# Patient Record
Sex: Female | Born: 1995 | Race: White | Hispanic: No | Marital: Single | Smoking: Never smoker
Health system: Southern US, Community
[De-identification: ages and names within clinical notes are randomized; demographics above are authoritative.]

## PROBLEM LIST (undated history)

## (undated) DIAGNOSIS — J302 Other seasonal allergic rhinitis: Secondary | ICD-10-CM

## (undated) DIAGNOSIS — F988 Other specified behavioral and emotional disorders with onset usually occurring in childhood and adolescence: Secondary | ICD-10-CM

---
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
## 2017-09-30 ENCOUNTER — Emergency Department (INDEPENDENT_AMBULATORY_CARE_PROVIDER_SITE_OTHER)
Admission: EM | Admit: 2017-09-30 | Discharge: 2017-09-30 | Disposition: A | Discharge status: Home / Self Care | Payer: Managed Care, Other (non HMO) | Source: Home / Self Care | Attending: Family Medicine | Admitting: Family Medicine

## 2017-09-30 ENCOUNTER — Other Ambulatory Visit: Payer: Self-pay

## 2017-09-30 ENCOUNTER — Encounter: Payer: Self-pay | Admitting: Emergency Medicine

## 2017-09-30 ENCOUNTER — Emergency Department (INDEPENDENT_AMBULATORY_CARE_PROVIDER_SITE_OTHER): Payer: Managed Care, Other (non HMO)

## 2017-09-30 DIAGNOSIS — S9782XA Crushing injury of left foot, initial encounter: Secondary | ICD-10-CM | POA: Diagnosis not present

## 2017-09-30 DIAGNOSIS — S8992XA Unspecified injury of left lower leg, initial encounter: Secondary | ICD-10-CM | POA: Diagnosis not present

## 2017-09-30 DIAGNOSIS — S9032XA Contusion of left foot, initial encounter: Secondary | ICD-10-CM

## 2017-09-30 DIAGNOSIS — S8012XA Contusion of left lower leg, initial encounter: Secondary | ICD-10-CM

## 2017-09-30 NOTE — Discharge Instructions (Signed)
Wear ace wrap until swelling resolves.  Elevate leg as much as possible for 2 to 3 days.  Apply ice pack for 20 to 30 minutes, 3 to 4 times daily  Continue until pain and swelling decrease. Use crutches for about 5 days until pain decreases.  Begin range of motion exercises as tolerated.  May take Ibuprofen 200mg , 3 tabs every 8 hours with food.
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
